# Patient Record
Sex: Female | Born: 1995
Health system: Southern US, Community
[De-identification: ages and names within clinical notes are randomized; demographics above are authoritative.]

## PROBLEM LIST (undated history)

## (undated) ENCOUNTER — Inpatient Hospital Stay (HOSPITAL_COMMUNITY): Payer: Self-pay

## (undated) DIAGNOSIS — Z87730 Personal history of (corrected) cleft lip and palate: Secondary | ICD-10-CM

## (undated) HISTORY — PX: CLEFT LIP REPAIR: SUR1164

## (undated) HISTORY — PX: CLEFT LIP REPAIR: SHX5315

## (undated) HISTORY — DX: Personal history of (corrected) cleft lip and palate: Z87.730

---
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
## 2017-01-13 IMAGING — US US OB LIMITED
1 series · 14 of 27 positions shown · non-contrast
Comparison: none

CLINICAL DATA: C/o sporadic lower back pain x 1 wk, patient has had
no prenatal care and is unsure of GA (LMP sometime around 04/24/14)

EXAM:
LIMITED OBSTETRIC ULTRASOUND

[Series 1: us ob limited · 0.20mm/px · 14 of 27 slices shown]
[im 1/27]
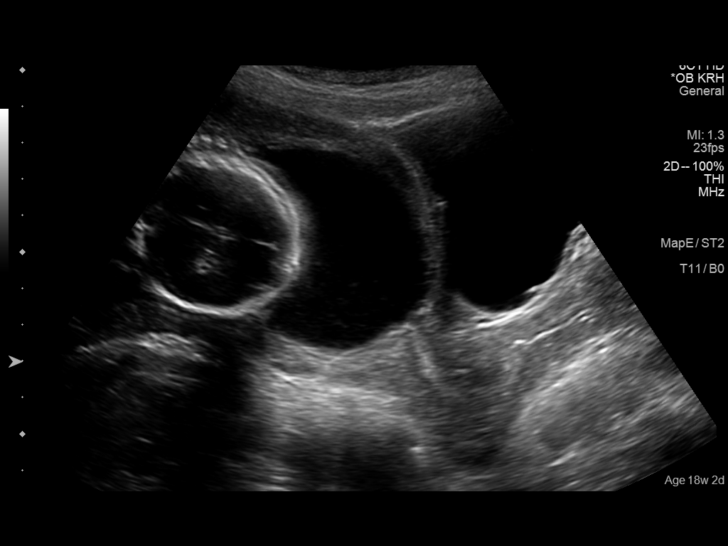
[im 3/27]
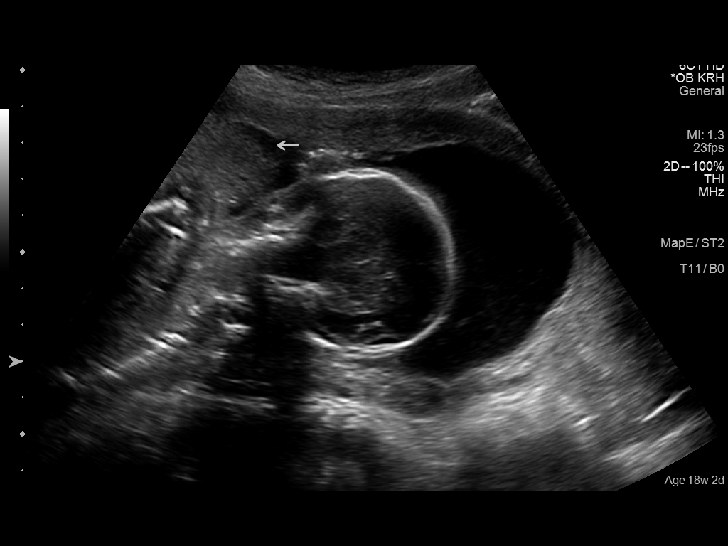
[im 5/27]
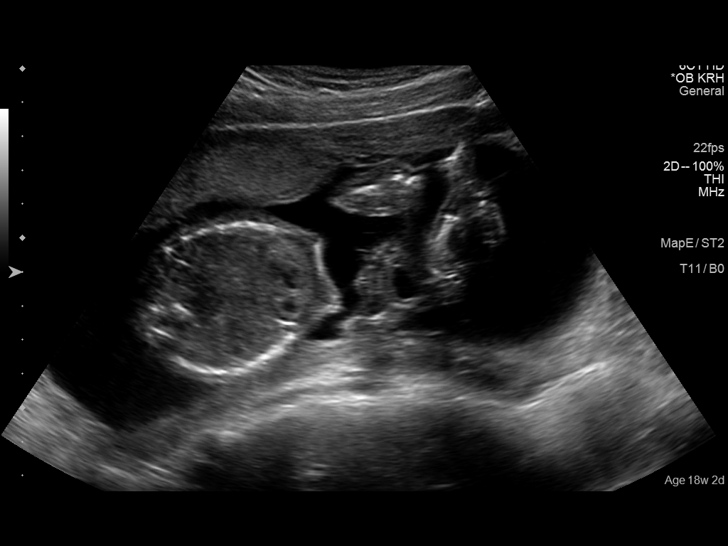
[im 7/27]
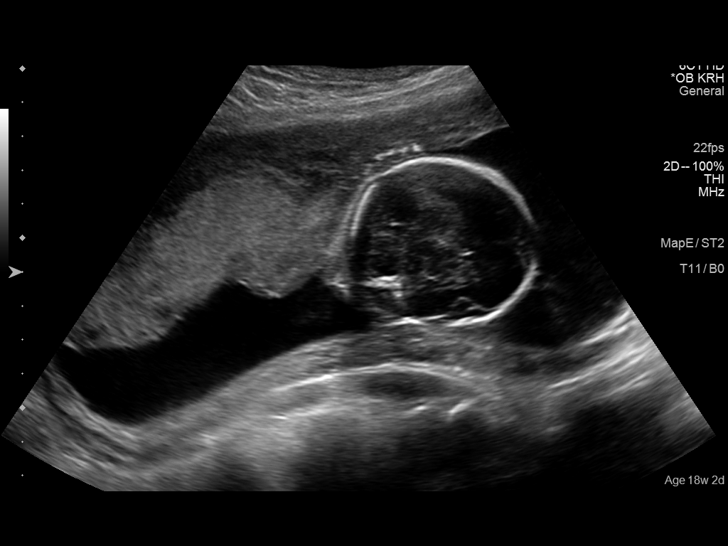
[im 9/27]
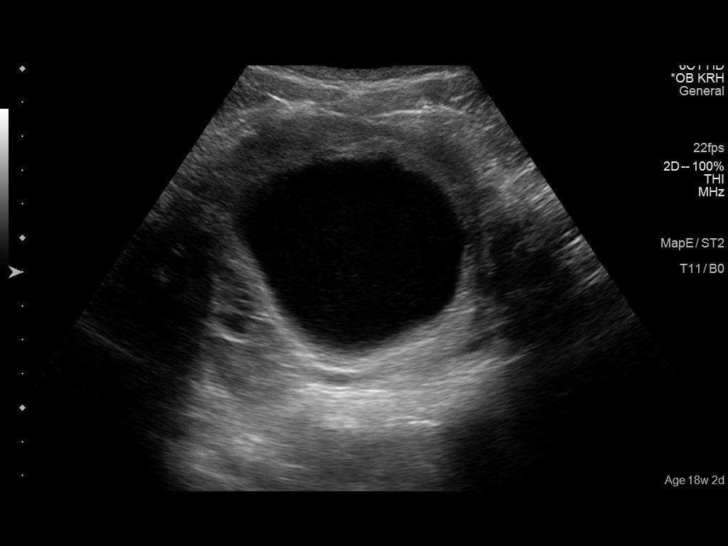
[im 11/27]
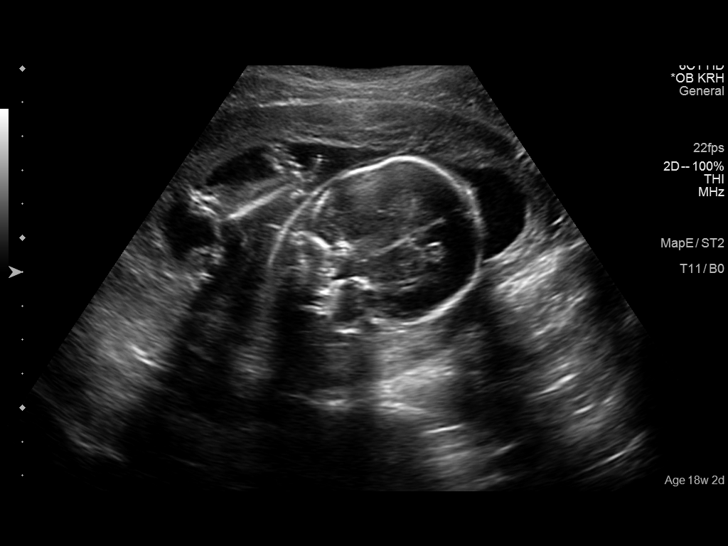
[im 13/27]
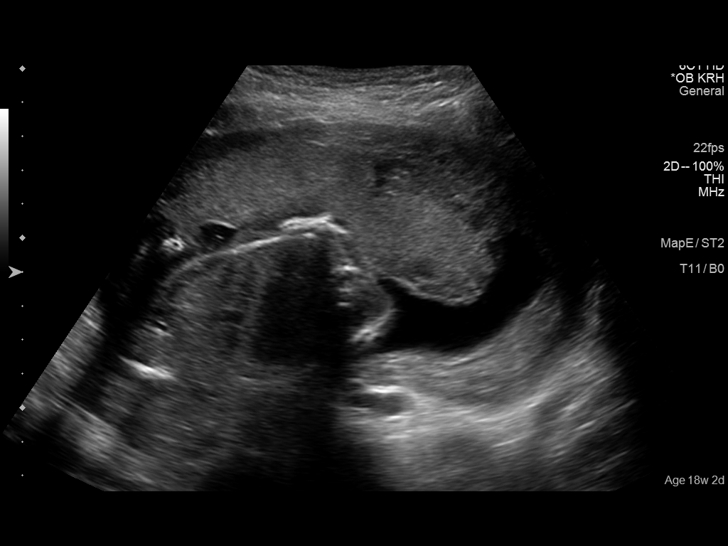
[im 15/27]
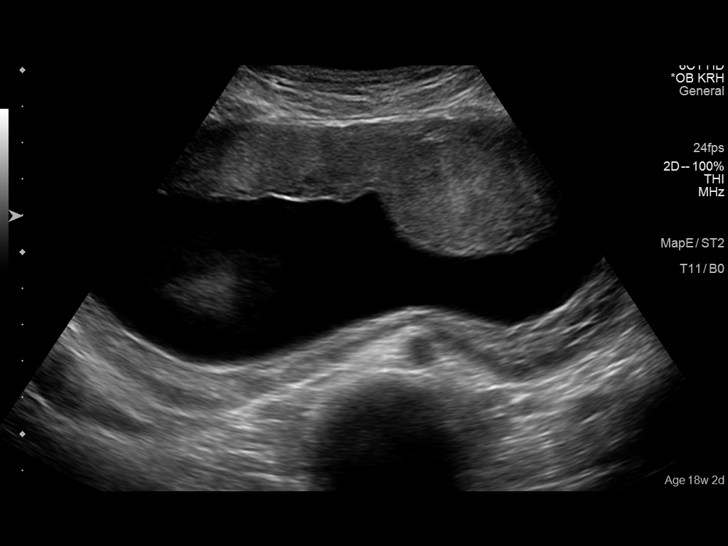
[im 17/27]
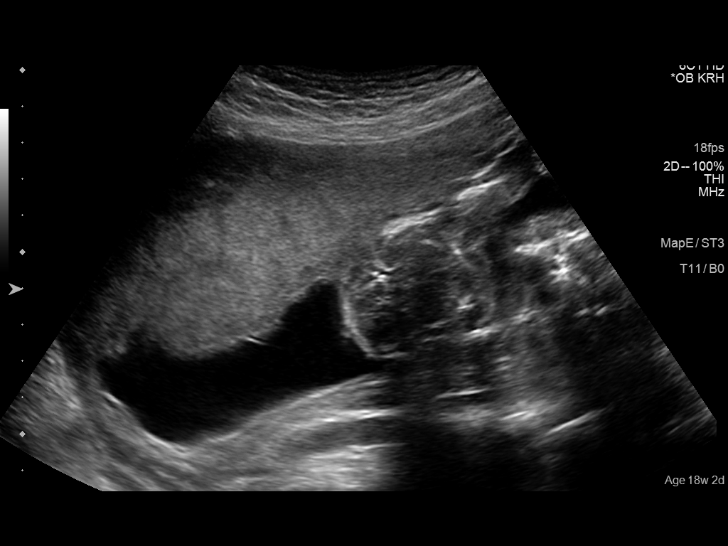
[im 19/27]
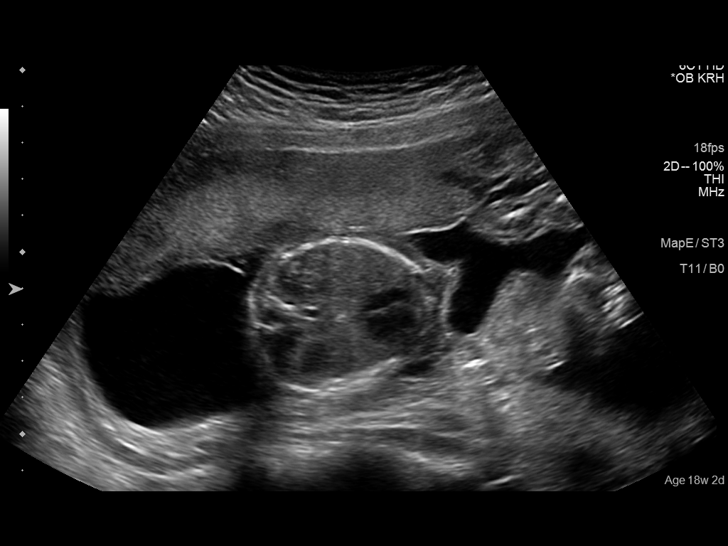
[im 21/27]
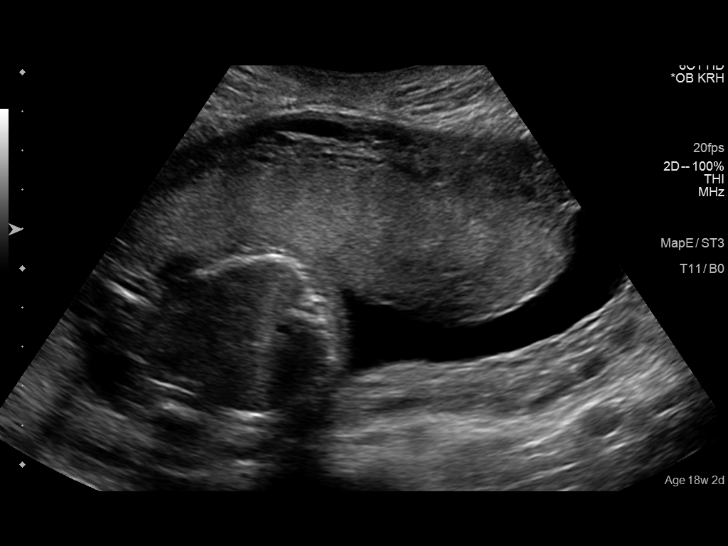
[im 23/27]
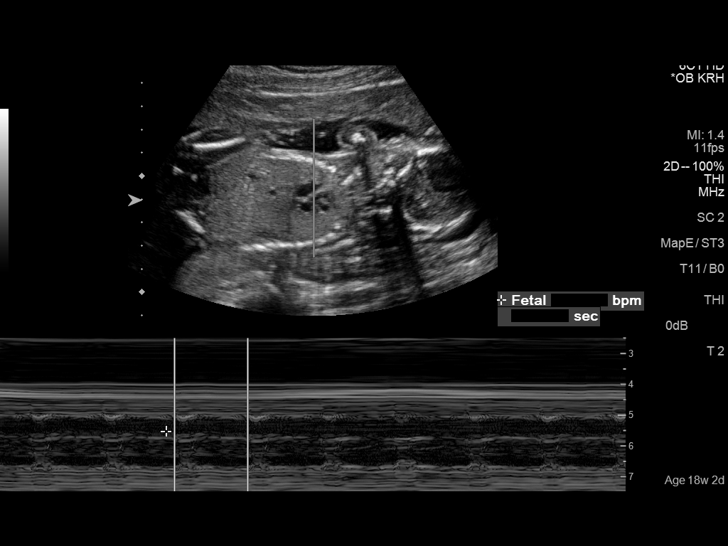
[im 25/27]
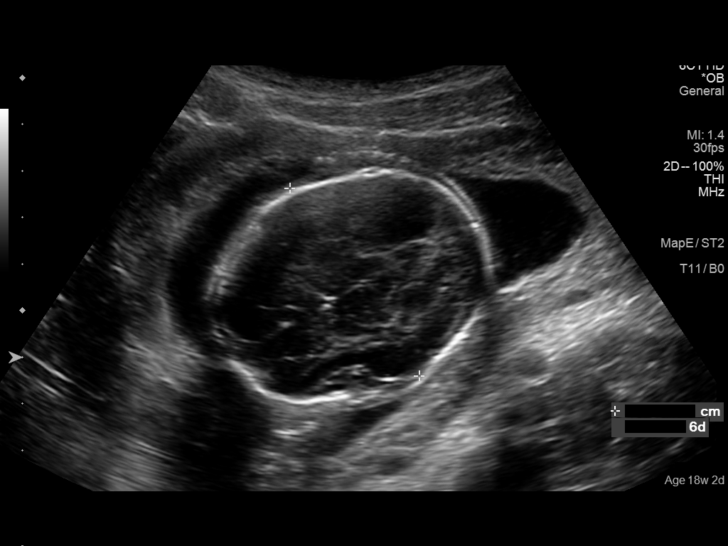
[im 27/27]
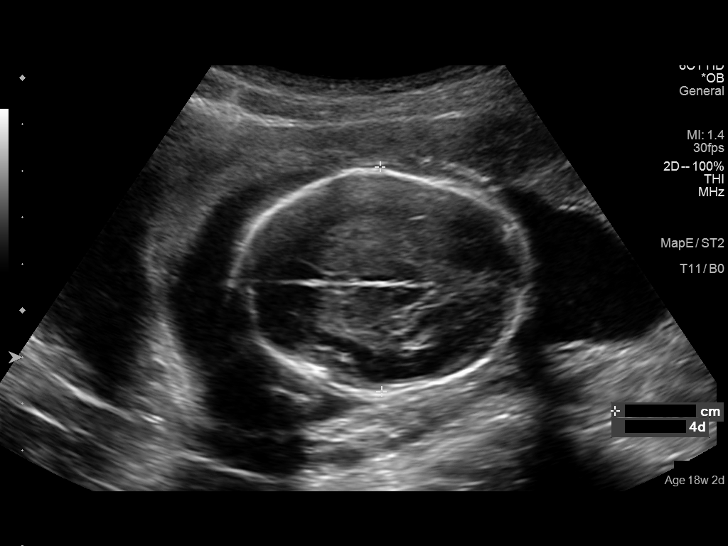

[14 of 27 positions shown; findings below may reference images not displayed]

FINDINGS: Number of Fetuses: 1

Heart Rate:  140 bpm

Movement: Yes

Presentation: Cephalic

Placental Location: Fundal and anterior

Previa: No

Amniotic Fluid (Subjective):  Within normal limits.

BPD:  4.8cm 20w  3d

MATERNAL FINDINGS:

Cervix:  Appears closed.

Uterus/Adnexae:  No abnormality visualized.
IMPRESSION: Single live intrauterine pregnancy with a measured gestational age
of 20 weeks and 3 days. No emergent fetal or maternal finding.

This exam is performed on an emergent basis and does not
comprehensively evaluate fetal size, dating, or anatomy; follow-up
complete OB US should be considered if further fetal assessment is
warranted.

## 2017-04-10 IMAGING — US US OB LIMITED
1 series · 13 of 13 positions shown · non-contrast
Comparison: none

CLINICAL DATA: Vaginal bleeding.  Thirty-two weeks pregnant.

EXAM:
LIMITED OBSTETRIC ULTRASOUND

[Series 1: us ob limited · 0.21mm/px · 13 acquisitions, 13 frames shown]
[im 1/13]
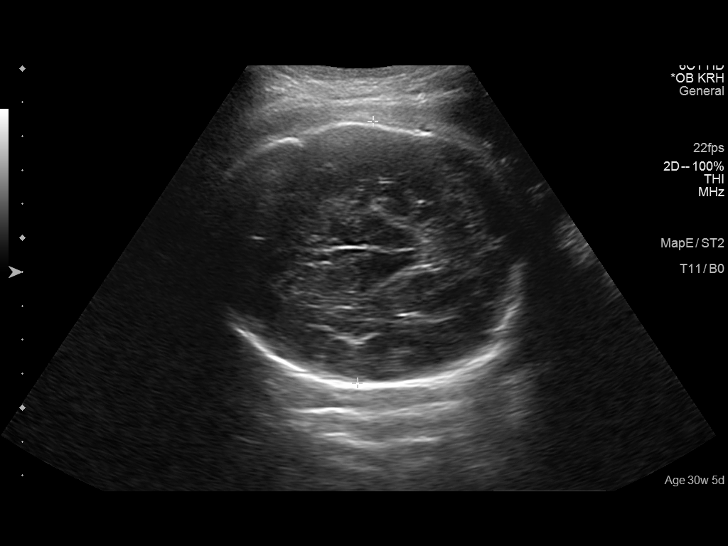
[im 2/13]
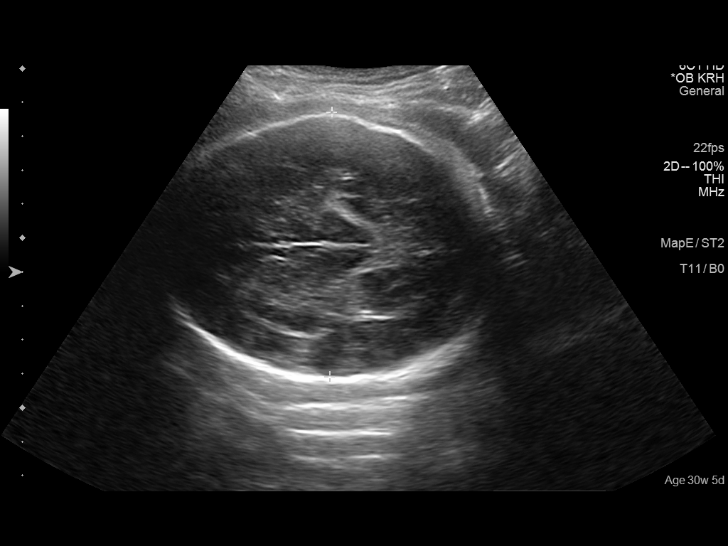
[im 3/13]
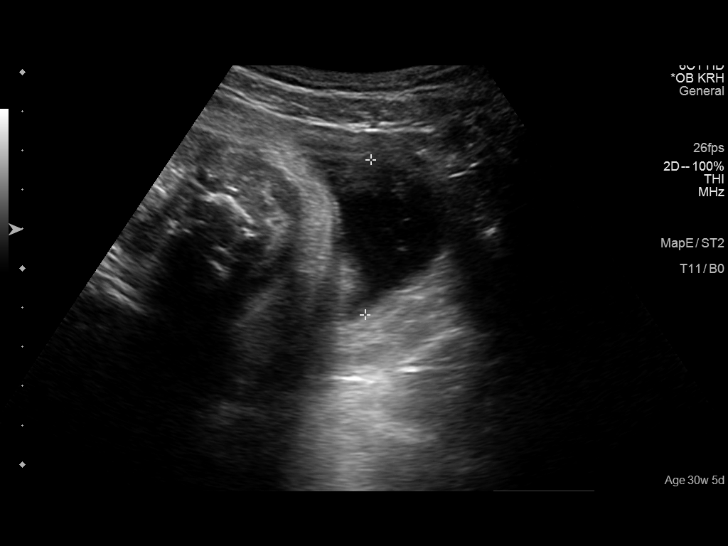
[im 4/13]
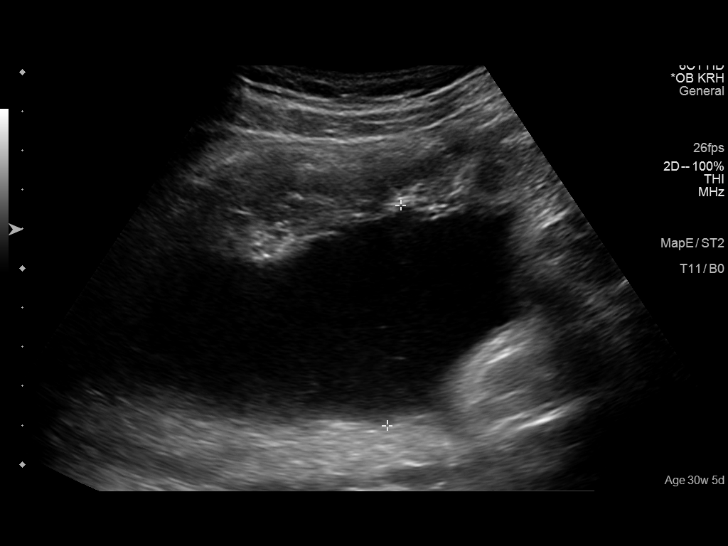
[im 5/13]
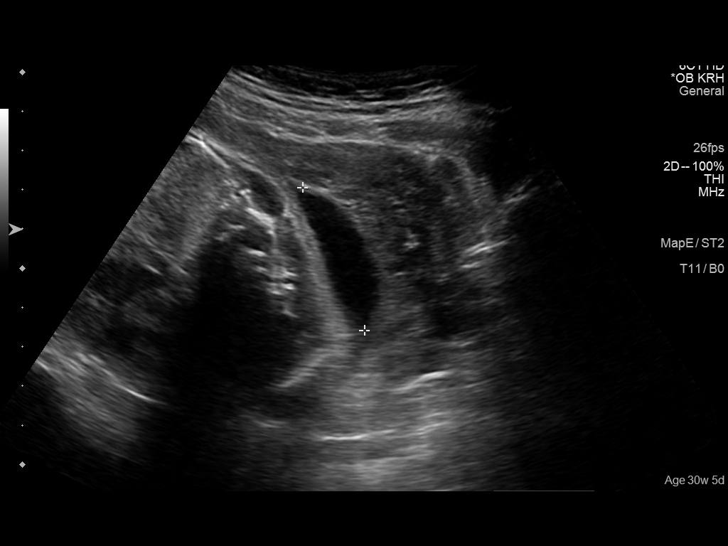
[im 6/13]
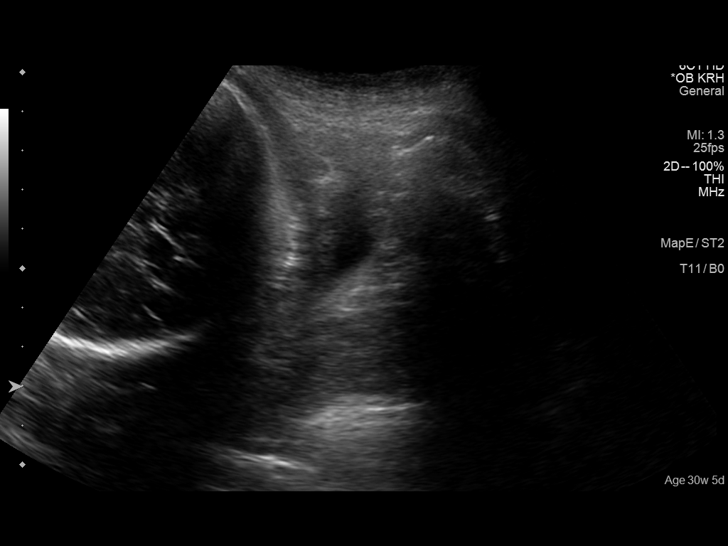
[im 7/13]
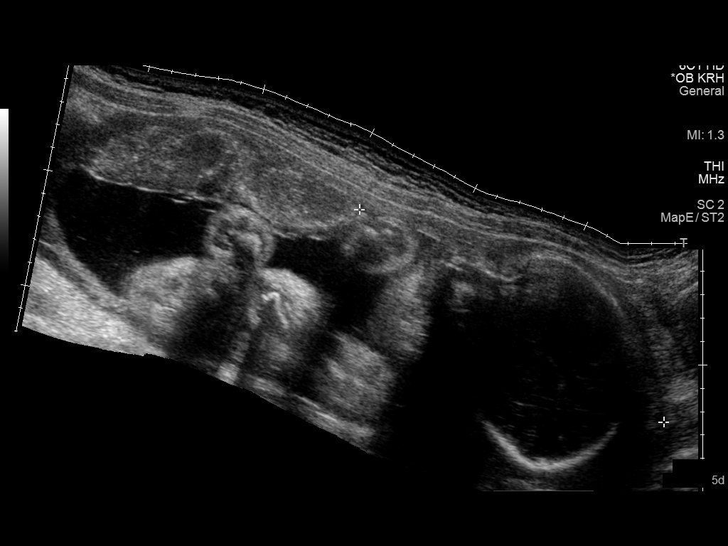
[im 8/13]
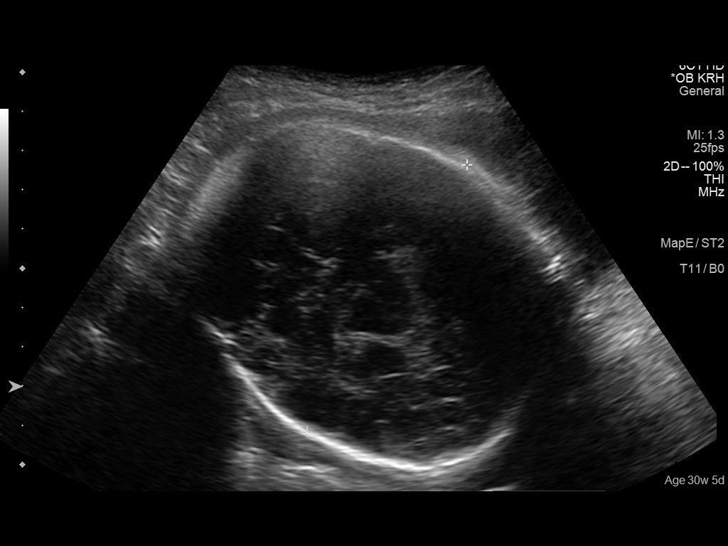
[im 9/13]
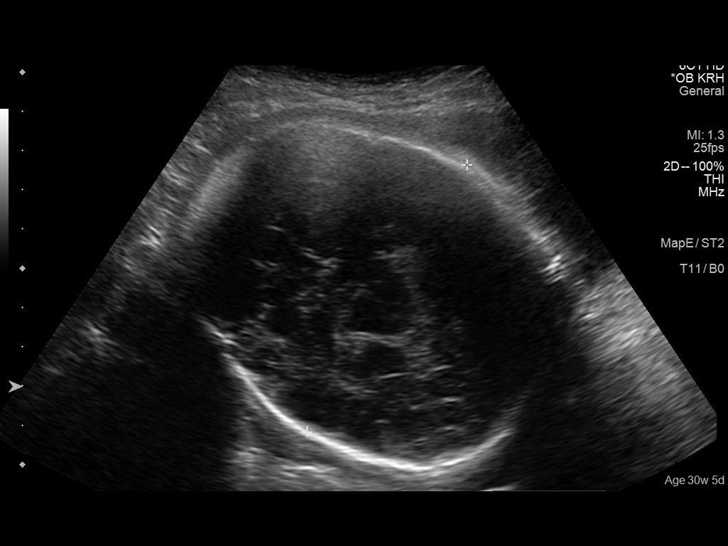
[im 10/13]
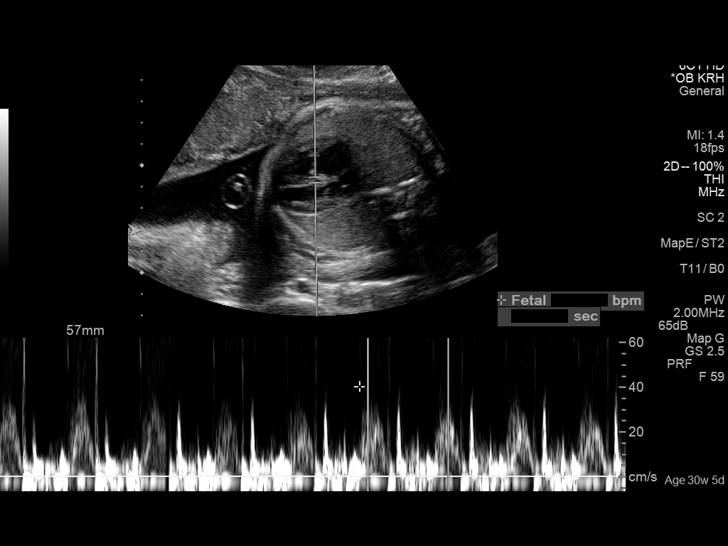
[im 11/13]
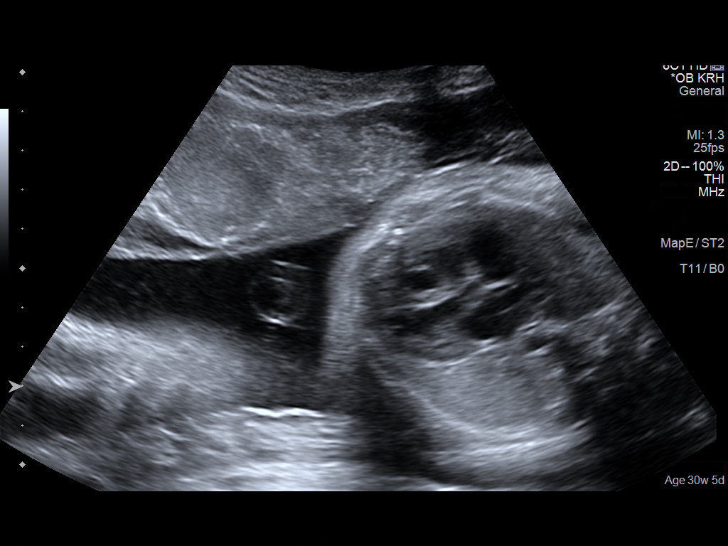
[im 12/13]
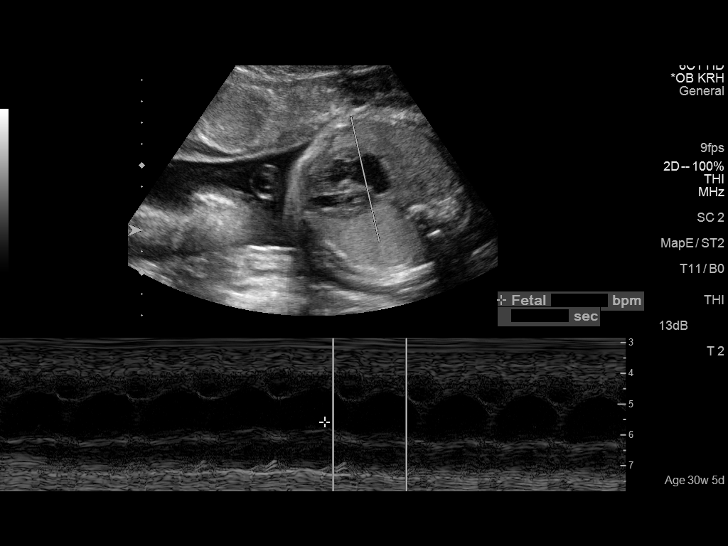
[im 13/13]
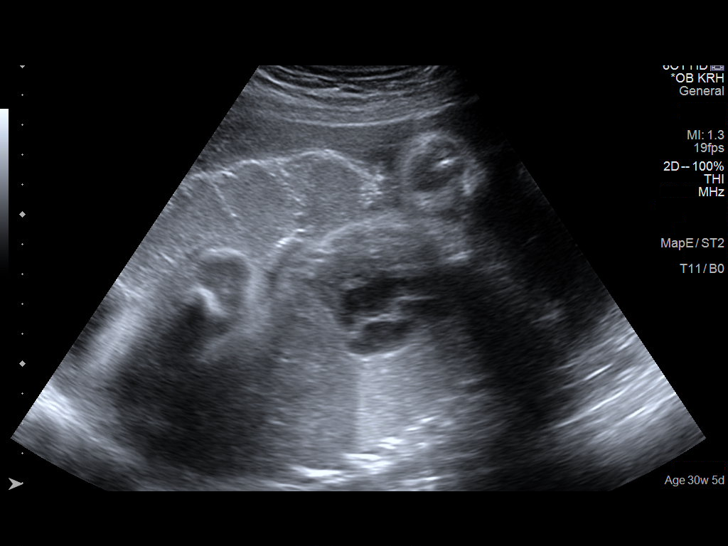

[13 of 13 positions shown; findings below may reference images not displayed]

FINDINGS: Number of Fetuses: 1

Heart Rate:  140 bpm

Movement: Yes

Presentation: Cephalic

Placental Location: Fundal and anterior

Previa: No

Amniotic Fluid (Subjective):  Within normal limits.

AFI = 18.1 cm

BPD:  7.8cm 31w  2d

MATERNAL FINDINGS:

Cervix:  Appears closed.

Uterus/Adnexae:  No abnormality visualized.
IMPRESSION: Single living intrauterine fetus in cephalic presentation. No acute
maternal findings visualized.

This exam is performed on an emergent basis and does not
comprehensively evaluate fetal size, dating, or anatomy; follow-up
complete OB US should be considered if further fetal assessment is
warranted.

## 2017-06-02 ENCOUNTER — Encounter: Payer: Self-pay | Admitting: *Deleted

## 2017-07-11 ENCOUNTER — Encounter: Payer: Self-pay | Admitting: Medical

## 2017-07-11 ENCOUNTER — Ambulatory Visit (INDEPENDENT_AMBULATORY_CARE_PROVIDER_SITE_OTHER): Payer: Medicaid Other | Admitting: Medical

## 2017-07-11 VITALS — BP 124/62 | HR 75 | Wt 156.5 lb

## 2017-07-11 DIAGNOSIS — Z309 Encounter for contraceptive management, unspecified: Secondary | ICD-10-CM

## 2017-07-11 DIAGNOSIS — T839XXA Unspecified complication of genitourinary prosthetic device, implant and graft, initial encounter: Secondary | ICD-10-CM

## 2017-07-11 NOTE — Patient Instructions (Signed)

## 2017-07-11 NOTE — Progress Notes (Signed)
  History:  Ms. Morgan Nelson is a 22 y.o. G1P1001 who presents to clinic today for IUD check due to irregular bleeding. The patient states that she was having fairly regular periods. She had IUD placed at her PP visit in 02/2015. She states that her period was longer and heavier in March, lasting 11 days. She states periods are usually 5 days and lighter. She states that bleeding started again on 4/3 and has been off and on over the last few days which prompted her to get evaluated.    The following portions of the patient's history were reviewed and updated as appropriate: allergies, current medications, family history, past medical history, social history, past surgical history and problem list.  Review of Systems:  Review of Systems  Constitutional: Negative for malaise/fatigue.  Gastrointestinal: Negative for abdominal pain.  Genitourinary:       + vaginal bleeding  Neurological: Negative for dizziness.      Objective:  Physical Exam BP 124/62 (BP Location: Right Arm, Patient Position: Sitting, Cuff Size: Normal)   Pulse 75   Wt 156 lb 8 oz (71 kg)   LMP 07/06/2017   BMI 27.72 kg/m  Physical Exam  Constitutional: She is oriented to person, place, and time. She appears well-developed and well-nourished. No distress.  HENT:  Head: Normocephalic.  Cardiovascular: Normal rate.  Pulmonary/Chest: Effort normal.  Abdominal: Soft.  Genitourinary: Uterus is not enlarged and not tender. Cervix exhibits no motion tenderness, no discharge and no friability. Right adnexum displays no mass and no tenderness. Left adnexum displays no mass and no tenderness. There is bleeding in the vagina. No vaginal discharge found.  Genitourinary Comments: Unable to visualize or palpate IUD strings  Neurological: She is alert and oriented to person, place, and time.  Skin: Skin is warm and dry. No erythema.  Psychiatric: She has a normal mood and affect.  Vitals reviewed.   Assessment & Plan:  1.  Complication of intrauterine device (IUD), unspecified complication, initial encounter (HCC) - US PELVIC COMPLETE WITH TRANSVAGINAL; will schedule and call patient with appointment  - Condom use or abstinence until IUD is visualized with US advised - Will follow-up based on US results to determine if change in contraceptive method is needed   Kathlene CoteWenzel, Morgan Teed N, PA-C 07/11/2017 7:31 PM

## 2017-07-18 ENCOUNTER — Encounter: Payer: Self-pay | Admitting: General Practice

## 2017-07-20 ENCOUNTER — Telehealth: Payer: Self-pay | Admitting: General Practice

## 2017-07-20 NOTE — Telephone Encounter (Signed)
Per chart review, patient has not checked mychart message regarding appt. Called patient, no answer- left message stating I am trying to reach you regarding an appt I have scheduled for you tomorrow, please call me back.

## 2017-07-21 ENCOUNTER — Ambulatory Visit (HOSPITAL_COMMUNITY)
Admission: RE | Admit: 2017-07-21 | Discharge: 2017-07-21 | Disposition: A | Discharge status: Home / Self Care | Payer: Medicaid Other | Source: Ambulatory Visit | Attending: Medical | Admitting: Medical

## 2017-07-21 DIAGNOSIS — T839XXA Unspecified complication of genitourinary prosthetic device, implant and graft, initial encounter: Secondary | ICD-10-CM | POA: Insufficient documentation

## 2017-07-21 DIAGNOSIS — N854 Malposition of uterus: Secondary | ICD-10-CM | POA: Insufficient documentation

## 2019-01-11 ENCOUNTER — Other Ambulatory Visit: Payer: Self-pay

## 2019-01-11 ENCOUNTER — Emergency Department (HOSPITAL_BASED_OUTPATIENT_CLINIC_OR_DEPARTMENT_OTHER)
Admission: EM | Admit: 2019-01-11 | Discharge: 2019-01-11 | Disposition: A | Discharge status: Home / Self Care | Payer: Medicaid Other | Attending: Emergency Medicine | Admitting: Emergency Medicine

## 2019-01-11 DIAGNOSIS — Z79899 Other long term (current) drug therapy: Secondary | ICD-10-CM | POA: Insufficient documentation

## 2019-01-11 DIAGNOSIS — U071 COVID-19: Secondary | ICD-10-CM | POA: Insufficient documentation

## 2019-01-11 DIAGNOSIS — Z20822 Contact with and (suspected) exposure to covid-19: Secondary | ICD-10-CM

## 2019-01-11 LAB — GROUP A STREP BY PCR: Group A Strep by PCR: NOT DETECTED

## 2019-01-11 LAB — SARS CORONAVIRUS 2 (TAT 6-24 HRS): SARS Coronavirus 2: POSITIVE — AB

## 2019-01-11 NOTE — Discharge Instructions (Addendum)
Please stay home and quarantine yourself. Only return to the ER if you are struggling to breath or cannot keep any fluids down.   *If your test is positive then your must quarantine for 10 days from your symptoms onset AND have no fever for 24 hours. This means that if your test is positive then the earliest you can leave your house is Next Thursday October 15th  *If your test is negative you must quarantine yourself for 14 days from your last exposure to your mother. This means, if your test is negative you must quarantine until October 21.

## 2019-01-11 NOTE — ED Triage Notes (Signed)
Pt states cough and sore throat for past 2 days.  Has had recent exposure to mom who has tested positive for covid yesterday.  Denies fever

## 2019-01-11 NOTE — ED Provider Notes (Signed)
[facility] EMERGENCY DEPARTMENT Provider Note   CSN: [id number] Arrival date & time: 01/11/19  1043     History   Chief Complaint Chief Complaint  Patient presents with  . Cough    HPI Morgan Nelson is a 23 y.o. female.     Patient is a 23 year old female with no significant past medical history presenting to the emergency department for cough and sore throat.  Patient reports that the symptoms started this past Sunday.  Reports that they are mild and improved with over-the-counter medications.  Reports that she is concerned because her mother was diagnosed with COVID-19.  She does not live with her mother but has been exposed to her mother 3 times the last week.  The last exposure was yesterday.  Denies any fever, chills, chest pain, shortness of breath, dysuria, diarrhea.  She has had one episode of nonbloody vomiting.     Past Medical History:  Diagnosis Date  . Hx of cleft palate with cleft lip     Patient Active Problem List   Diagnosis Date Noted  . Personal history of corrected cleft lip and palate     Past Surgical History:  Procedure Laterality Date  . CLEFT LIP REPAIR    . CLEFT LIP REPAIR       OB History    Gravida  1   Para  1   Term  1   Preterm      AB      Living  1     SAB      TAB      Ectopic      Multiple  0   Live Births  1            Home Medications    Prior to Admission medications   Medication Sig Start Date End Date Taking? Authorizing Provider  fluticasone (FLONASE) 50 MCG/ACT nasal spray Place 2 sprays into both nostrils daily. Patient not taking: Reported on 07/11/2017 04/13/16   Everlene Farrier, PA-C  Multiple Vitamin (MULTIVITAMIN WITH MINERALS) TABS tablet Take 1 tablet by mouth daily.    [provider]  naproxen (NAPROSYN) 250 MG tablet Take 1 tablet (250 mg total) by mouth 2 (two) times daily with a meal. Patient not taking: Reported on 07/11/2017 04/13/16   Everlene Farrier, PA-C   ondansetron (ZOFRAN) 4 MG tablet Take 1 tablet (4 mg total) by mouth every 8 (eight) hours as needed for nausea or vomiting. Patient not taking: Reported on 07/11/2017 09/02/16   Trixie Dredge, PA-C  Prenatal Multivit-Min-Fe-FA (PRENATAL VITAMINS) 0.8 MG tablet Take 1 tablet by mouth daily. Patient not taking: Reported on 03/28/2015 11/07/14   Wouk, Wilfred Curtis, MD    Family History Family History  Problem Relation Age of Onset  . Hypertension Mother   . Arthritis Neg Hx   . Alcohol abuse Neg Hx   . Asthma Neg Hx   . Birth defects Neg Hx   . Cancer Neg Hx   . COPD Neg Hx   . Depression Neg Hx   . Diabetes Neg Hx   . Drug abuse Neg Hx   . Early death Neg Hx   . Hearing loss Neg Hx   . Hyperlipidemia Neg Hx   . Heart disease Neg Hx   . Kidney disease Neg Hx   . Learning disabilities Neg Hx   . Mental illness Neg Hx   . Mental retardation Neg Hx   . Miscarriages / Stillbirths Neg  Hx   . Stroke Neg Hx   . Vision loss Neg Hx   . Varicose Veins Neg Hx     Social History Social History   Tobacco Use  . Smoking status: Never Smoker  . Smokeless tobacco: Never Used  Substance Use Topics  . Alcohol use: No  . Drug use: No     Allergies   Patient has no known allergies.   Review of Systems Review of Systems  Constitutional: Negative for appetite change, diaphoresis, fatigue and fever.  HENT: Positive for sore throat. Negative for congestion, ear pain, sinus pressure, sinus pain and sneezing.   Respiratory: Positive for cough. Negative for shortness of breath and wheezing.   Cardiovascular: Negative for chest pain and palpitations.  Gastrointestinal: Positive for nausea and vomiting. Negative for abdominal pain, constipation and diarrhea.  Genitourinary: Negative for dysuria.  Musculoskeletal: Negative for arthralgias, back pain and myalgias.  Skin: Negative for rash.  Neurological: Negative for dizziness, light-headedness and headaches.  Hematological: Does not  bruise/bleed easily.     Physical Exam Updated Vital Signs BP 128/70 (BP Location: Right Arm)   Pulse (!) 102   Temp 98.3 F (36.8 C) (Oral)   Resp 16   Ht  (1.6 m)   Wt 78.9 kg   SpO2 100%   BMI 30.82 kg/m   Physical Exam Vitals signs and nursing note reviewed. Exam conducted with a chaperone present.  Constitutional:      Appearance: Normal appearance.  HENT:     Head: Normocephalic.     Right Ear: Tympanic membrane normal.     Left Ear: Tympanic membrane normal.     Nose: Nose normal. No congestion.     Mouth/Throat:     Mouth: Mucous membranes are moist.  Eyes:     Conjunctiva/sclera: Conjunctivae normal.  Cardiovascular:     Rate and Rhythm: Normal rate and regular rhythm.     Pulses: Normal pulses.     Heart sounds: Normal heart sounds.  Pulmonary:     Effort: Pulmonary effort is normal. No respiratory distress.     Breath sounds: Normal breath sounds.  Abdominal:     General: Abdomen is flat. There is no distension.     Tenderness: There is no abdominal tenderness. There is no guarding or rebound.  Skin:    General: Skin is dry.  Neurological:     Mental Status: She is alert.  Psychiatric:        Mood and Affect: Mood normal.      ED Treatments / Results  Labs (all labs ordered are listed, but only abnormal results are displayed) Labs Reviewed  GROUP A STREP BY PCR  SARS CORONAVIRUS 2 (TAT 6-24 HRS)    EKG None  Radiology No results found.  Procedures Procedures (including critical care time)  Medications Ordered in ED Medications - No data to display   Initial Impression / Assessment and Plan / ED Course  I have reviewed the triage vital signs and the nursing notes.  Pertinent labs & imaging results that were available during my care of the patient were reviewed by me and considered in my medical decision making (see chart for details).        Based on review of vitals, medical screening exam, lab work and/or imaging, there  does not appear to be an acute, emergent etiology for the patient's symptoms. Counseled pt on good return precautions and encouraged both PCP and ED follow-up as needed.  Prior to  discharge, I also discussed incidental imaging findings with patient in detail and advised appropriate, recommended follow-up in detail.  Clinical Impression: 1. Suspected 2019 novel coronavirus infection     Disposition: Discharge  Prior to providing a prescription for a controlled substance, I independently reviewed the patient's recent prescription history on the [facility]. The patient had no recent or regular prescriptions and was deemed appropriate for a brief, less than 3 day prescription of narcotic for acute analgesia.  This note was prepared with assistance of Systems analyst. Occasional wrong-word or sound-a-like substitutions may have occurred due to the inherent limitations of voice recognition software.   Final Clinical Impressions(s) / ED Diagnoses   Final diagnoses:  Suspected 2019 novel coronavirus infection    ED Discharge Orders    None       Kristine Royal 01/11/19 1117    Wyvonnia Dusky, MD 01/11/19 1300

## 2019-04-01 ENCOUNTER — Emergency Department (HOSPITAL_BASED_OUTPATIENT_CLINIC_OR_DEPARTMENT_OTHER)
Admission: EM | Admit: 2019-04-01 | Discharge: 2019-04-01 | Discharge status: Absent without leave | Payer: Medicaid Other

## 2019-04-04 ENCOUNTER — Other Ambulatory Visit: Payer: Self-pay

## 2019-04-04 ENCOUNTER — Encounter (HOSPITAL_BASED_OUTPATIENT_CLINIC_OR_DEPARTMENT_OTHER): Payer: Self-pay

## 2019-04-04 ENCOUNTER — Emergency Department (HOSPITAL_BASED_OUTPATIENT_CLINIC_OR_DEPARTMENT_OTHER)
Admission: EM | Admit: 2019-04-04 | Discharge: 2019-04-04 | Disposition: A | Discharge status: Home / Self Care | Payer: Medicaid Other | Attending: Emergency Medicine | Admitting: Emergency Medicine

## 2019-04-04 DIAGNOSIS — R0981 Nasal congestion: Secondary | ICD-10-CM | POA: Insufficient documentation

## 2019-04-04 DIAGNOSIS — R070 Pain in throat: Secondary | ICD-10-CM | POA: Diagnosis present

## 2019-04-04 DIAGNOSIS — R05 Cough: Secondary | ICD-10-CM | POA: Diagnosis not present

## 2019-04-04 DIAGNOSIS — R509 Fever, unspecified: Secondary | ICD-10-CM | POA: Diagnosis not present

## 2019-04-04 DIAGNOSIS — R5381 Other malaise: Secondary | ICD-10-CM | POA: Diagnosis not present

## 2019-04-04 DIAGNOSIS — J069 Acute upper respiratory infection, unspecified: Secondary | ICD-10-CM | POA: Diagnosis not present

## 2019-04-04 DIAGNOSIS — Z8619 Personal history of other infectious and parasitic diseases: Secondary | ICD-10-CM | POA: Insufficient documentation

## 2019-04-04 DIAGNOSIS — H9202 Otalgia, left ear: Secondary | ICD-10-CM | POA: Diagnosis not present

## 2019-04-04 DIAGNOSIS — Z20828 Contact with and (suspected) exposure to other viral communicable diseases: Secondary | ICD-10-CM | POA: Insufficient documentation

## 2019-04-04 LAB — SARS CORONAVIRUS 2 (TAT 6-24 HRS): SARS Coronavirus 2: NEGATIVE

## 2019-04-04 NOTE — Discharge Instructions (Signed)
Your Covid test will take 24 to 48 hours to resolve.  In the meantime I recommend you self quarantine.

## 2019-04-04 NOTE — ED Triage Notes (Signed)
Pt c/o flu like sx x 4 days-NAD-steady gait 

## 2019-04-04 NOTE — ED Provider Notes (Signed)
[facility] EMERGENCY DEPARTMENT Provider Note   CSN: [id number] Arrival date & time: 04/04/19  1218     History Chief Complaint  Patient presents with  . Cough    Morgan Nelson is a 23 y.o. female presenting to emergency department with sore throat, cough and congestion.  She reports her symptoms began Sunday with a sore throat.  Monday when she woke up she felt like she had a fever and checked her temp and it was 100.27F.  For the past 3 days she has had persistent coughing.  She also describes an ache in her left ear.  She feels generally malaised.  Hx of strep throat most recently 1 year ago  She does report that she was tested positive for Covid back in October.  She subsequently has had a Covid test in November, at [employer] where she works, and that test was negative.  She is concerned she may have been reinfected.  NKDA No medical problems No meds at baseline  HPI     Past Medical History:  Diagnosis Date  . Hx of cleft palate with cleft lip     Patient Active Problem List   Diagnosis Date Noted  . Personal history of corrected cleft lip and palate     Past Surgical History:  Procedure Laterality Date  . CLEFT LIP REPAIR    . CLEFT LIP REPAIR       OB History    Gravida  1   Para  1   Term  1   Preterm      AB      Living  1     SAB      TAB      Ectopic      Multiple  0   Live Births  1           Family History  Problem Relation Age of Onset  . Hypertension Mother   . Arthritis Neg Hx   . Alcohol abuse Neg Hx   . Asthma Neg Hx   . Birth defects Neg Hx   . Cancer Neg Hx   . COPD Neg Hx   . Depression Neg Hx   . Diabetes Neg Hx   . Drug abuse Neg Hx   . Early death Neg Hx   . Hearing loss Neg Hx   . Hyperlipidemia Neg Hx   . Heart disease Neg Hx   . Kidney disease Neg Hx   . Learning disabilities Neg Hx   . Mental illness Neg Hx   . Mental retardation Neg Hx   . Miscarriages / Stillbirths Neg Hx   .  Stroke Neg Hx   . Vision loss Neg Hx   . Varicose Veins Neg Hx     Social History   Tobacco Use  . Smoking status: Never Smoker  . Smokeless tobacco: Never Used  Substance Use Topics  . Alcohol use: No  . Drug use: No    Home Medications Prior to Admission medications   Medication Sig Start Date End Date Taking? Authorizing Provider  fluticasone (FLONASE) 50 MCG/ACT nasal spray Place 2 sprays into both nostrils daily. Patient not taking: Reported on 07/11/2017 04/13/16   Waynetta Pean, PA-C  Multiple Vitamin (MULTIVITAMIN WITH MINERALS) TABS tablet Take 1 tablet by mouth daily.    [provider]  naproxen (NAPROSYN) 250 MG tablet Take 1 tablet (250 mg total) by mouth 2 (two) times daily with a meal. Patient not  taking: Reported on 07/11/2017 04/13/16   Everlene Farrieransie, William, PA-C  ondansetron (ZOFRAN) 4 MG tablet Take 1 tablet (4 mg total) by mouth every 8 (eight) hours as needed for nausea or vomiting. Patient not taking: Reported on 07/11/2017 09/02/16   Trixie DredgeWest, Emily, PA-C  Prenatal Multivit-Min-Fe-FA (PRENATAL VITAMINS) 0.8 MG tablet Take 1 tablet by mouth daily. Patient not taking: Reported on 03/28/2015 11/07/14   Wouk, Wilfred CurtisNoah Bedford, MD    Allergies    Patient has no known allergies.  Review of Systems   Review of Systems  Constitutional: Positive for appetite change, fatigue and fever. Negative for chills.  HENT: Positive for congestion, ear pain, rhinorrhea and sore throat. Negative for ear discharge, trouble swallowing and voice change.   Eyes: Negative for photophobia and visual disturbance.  Respiratory: Positive for cough. Negative for shortness of breath.   Cardiovascular: Negative for chest pain, palpitations and leg swelling.  Gastrointestinal: Negative for abdominal pain, nausea and vomiting.  Genitourinary: Negative for difficulty urinating and dysuria.  Musculoskeletal: Positive for myalgias. Negative for arthralgias.  Skin: Negative for pallor and rash.   Neurological: Negative for syncope and light-headedness.  All other systems reviewed and are negative.    Physical Exam Updated Vital Signs BP 116/76 (BP Location: Left Arm)   Pulse (!) 105   Temp 98.5 F (36.9 C) (Oral)   Resp 16   Ht 5\' 3"  (1.6 m)   Wt 79.4 kg   SpO2 100%   BMI 31.00 kg/m   Physical Exam Vitals and nursing note reviewed.  Constitutional:      General: She is not in acute distress.    Appearance: She is well-developed.  HENT:     Head: Normocephalic and atraumatic.     Comments: Oropharynx non-erythematous.  No tonsillar swelling or exudate.  No uvular deviation.  No drooling. No brawny edema. No stridor. Voice is not muffled.     Right Ear: Hearing, tympanic membrane and ear canal normal.     Left Ear: Hearing, tympanic membrane and ear canal normal.  Eyes:     Conjunctiva/sclera: Conjunctivae normal.  Cardiovascular:     Rate and Rhythm: Regular rhythm. Tachycardia present.     Pulses: Normal pulses.     Heart sounds: No murmur.     Comments: Hr 100 Pulmonary:     Effort: Pulmonary effort is normal. No respiratory distress.     Breath sounds: Normal breath sounds.  Musculoskeletal:     Cervical back: Neck supple.  Skin:    General: Skin is warm and dry.  Neurological:     Mental Status: She is alert.  Psychiatric:        Mood and Affect: Mood normal.        Behavior: Behavior normal.     ED Results / Procedures / Treatments   Labs (all labs ordered are listed, but only abnormal results are displayed) Labs Reviewed  SARS CORONAVIRUS 2 (TAT 6-24 HRS)    EKG None  Radiology No results found.  Procedures Procedures (including critical care time)  Medications Ordered in ED Medications - No data to display  ED Course  I have reviewed the triage vital signs and the nursing notes.  Pertinent labs & imaging results that were available during my care of the patient were reviewed by me and considered in my medical decision making  (see chart for details).  23 yo female presenting with cough, congestion, fevers, sore throat.  No signs or symptoms of strep throat or  peritonsillar abscess or dental infection on oral exam  No fever or pulmonary findings suggestive of PNA  TM's clear, no sign of otitis media  Low suspicion for bacterial infection or sepsis.  This is likely a viral syndrome.  She has had COVID in the past, but has since tested negative.  Reasonable to retest her today, given her potential work restrictions [employer]).  Otherwise stable for discharge  [place] Schmieg was evaluated in Emergency Department on 04/04/2019 for the symptoms described in the history of present illness. She was evaluated in the context of the global COVID-19 pandemic, which necessitated consideration that the patient might be at risk for infection with the SARS-CoV-2 virus that causes COVID-19. Institutional protocols and algorithms that pertain to the evaluation of patients at risk for COVID-19 are in a state of rapid change based on information released by regulatory bodies including the CDC and federal and state organizations. These policies and algorithms were followed during the patient's care in the ED.  This note was dictated using dragon dictation software.  Please be aware that there may be minor translation errors as a result of this oral dictation  Final Clinical Impression(s) / ED Diagnoses Final diagnoses:  Viral URI with cough    Rx / DC Orders ED Discharge Orders    None       Jakoby Melendrez, Kermit Balo, MD 04/04/19 [phone]

## 2021-05-04 DIAGNOSIS — Z975 Presence of (intrauterine) contraceptive device: Secondary | ICD-10-CM | POA: Insufficient documentation

## 2021-05-04 DIAGNOSIS — G43109 Migraine with aura, not intractable, without status migrainosus: Secondary | ICD-10-CM | POA: Insufficient documentation

## 2022-08-06 ENCOUNTER — Ambulatory Visit (INDEPENDENT_AMBULATORY_CARE_PROVIDER_SITE_OTHER): Payer: BLUE CROSS/BLUE SHIELD

## 2022-08-06 ENCOUNTER — Ambulatory Visit
Admission: EM | Admit: 2022-08-06 | Discharge: 2022-08-06 | Disposition: A | Discharge status: Home / Self Care | Payer: BLUE CROSS/BLUE SHIELD | Attending: Family Medicine | Admitting: Family Medicine

## 2022-08-06 DIAGNOSIS — M25561 Pain in right knee: Secondary | ICD-10-CM

## 2022-08-06 DIAGNOSIS — M79671 Pain in right foot: Secondary | ICD-10-CM

## 2022-08-06 DIAGNOSIS — M25571 Pain in right ankle and joints of right foot: Secondary | ICD-10-CM

## 2022-08-06 NOTE — ED Provider Notes (Signed)
[facility]    CSN: [id number] Arrival date & time: 08/06/22  0820      History   Chief Complaint Chief Complaint  Patient presents with   Foot Pain    HPI [name] is a 27 y.o. female.   HPI 27 year old female presents with right foot pain for 4 months.  Reports taking Tylenol for pain.  PMH significant for obesity and migraine with aura.  Patient is accompanied by her friend this afternoon.  Past Medical History:  Diagnosis Date   Hx of cleft palate with cleft lip     Patient Active Problem List   Diagnosis Date Noted   Migraine with aura 05/04/2021   IUD (intrauterine device) in place 05/04/2021   Personal history of corrected cleft lip and palate     Past Surgical History:  Procedure Laterality Date   CLEFT LIP REPAIR     CLEFT LIP REPAIR      OB History     Gravida  1   Para  1   Term  1   Preterm      AB      Living  1      SAB      IAB      Ectopic      Multiple  0   Live Births  1            Home Medications    Prior to Admission medications   Medication Sig Start Date End Date Taking? Authorizing Provider  fluticasone (FLONASE) 50 MCG/ACT nasal spray Place 2 sprays into both nostrils daily. Patient not taking: Reported on 07/11/2017 04/13/16   Everlene Farrier, PA-C  Multiple Vitamin (MULTIVITAMIN WITH MINERALS) TABS tablet Take 1 tablet by mouth daily.    [provider]  naproxen (NAPROSYN) 250 MG tablet Take 1 tablet (250 mg total) by mouth 2 (two) times daily with a meal. Patient not taking: Reported on 07/11/2017 04/13/16   Everlene Farrier, PA-C  ondansetron (ZOFRAN) 4 MG tablet Take 1 tablet (4 mg total) by mouth every 8 (eight) hours as needed for nausea or vomiting. Patient not taking: Reported on 07/11/2017 09/02/16   Trixie Dredge, PA-C  Prenatal Multivit-Min-Fe-FA (PRENATAL VITAMINS) 0.8 MG tablet Take 1 tablet by mouth daily. Patient not taking: Reported on 03/28/2015 11/07/14   Wouk, Wilfred Curtis, MD     Family History Family History  Problem Relation Age of Onset   Hypertension Mother    Arthritis Neg Hx    Alcohol abuse Neg Hx    Asthma Neg Hx    Birth defects Neg Hx    Cancer Neg Hx    COPD Neg Hx    Depression Neg Hx    Diabetes Neg Hx    Drug abuse Neg Hx    Early death Neg Hx    Hearing loss Neg Hx    Hyperlipidemia Neg Hx    Heart disease Neg Hx    Kidney disease Neg Hx    Learning disabilities Neg Hx    Mental illness Neg Hx    Mental retardation Neg Hx    Miscarriages / Stillbirths Neg Hx    Stroke Neg Hx    Vision loss Neg Hx    Varicose Veins Neg Hx     Social History Social History   Tobacco Use   Smoking status: Never   Smokeless tobacco: Never  Vaping Use   Vaping Use: Never used  Substance Use Topics  Alcohol use: No   Drug use: No     Allergies   Patient has no known allergies.   Review of Systems Review of Systems  Musculoskeletal:        Foot pain x 4 months     Physical Exam Triage Vital Signs ED Triage Vitals  Enc Vitals Group     BP 08/06/22 0841 (!) 146/79     Pulse Rate 08/06/22 0841 98     Resp 08/06/22 0841 18     Temp 08/06/22 0841 98.1 F (36.7 C)     Temp Source 08/06/22 0841 Oral     SpO2 08/06/22 0841 98 %     Weight --      Height --      Head Circumference --      Peak Flow --      Pain Score 08/06/22 0835 8     Pain Loc --      Pain Edu? --      Excl. in GC? --    No data found.  Updated Vital Signs BP (!) 146/79 (BP Location: Left Arm)   Pulse 98   Temp 98.1 F (36.7 C) (Oral)   Resp 18   LMP 08/04/2022 (Approximate)   SpO2 98%      Physical Exam Vitals and nursing note reviewed.  Constitutional:      Appearance: Normal appearance. She is normal weight.  HENT:     Head: Normocephalic and atraumatic.     Mouth/Throat:     Mouth: Mucous membranes are moist.     Pharynx: Oropharynx is clear.  Eyes:     Extraocular Movements: Extraocular movements intact.     Conjunctiva/sclera:  Conjunctivae normal.     Pupils: Pupils are equal, round, and reactive to light.  Cardiovascular:     Rate and Rhythm: Normal rate and regular rhythm.     Pulses: Normal pulses.     Heart sounds: Normal heart sounds.  Pulmonary:     Effort: Pulmonary effort is normal.     Breath sounds: Normal breath sounds. No wheezing, rhonchi or rales.  Musculoskeletal:        General: Normal range of motion.     Cervical back: Normal range of motion and neck supple.     Comments: Right foot: FROM with dorsi/plantar flexion, patient reports mild tender knot over medial aspect at arch-unnoticed on exam today  Skin:    General: Skin is warm and dry.  Neurological:     General: No focal deficit present.     Mental Status: She is alert and oriented to person, place, and time. Mental status is at baseline.      UC Treatments / Results  Labs (all labs ordered are listed, but only abnormal results are displayed) Labs Reviewed - No data to display  EKG   Radiology DG Foot Complete Right  Result Date: 08/06/2022 CLINICAL DATA:  27 year old female with medial right foot pain for 4 months. EXAM: RIGHT FOOT COMPLETE - 3+ VIEW COMPARISON:  None Available. FINDINGS: There is no evidence of fracture or dislocation. There is no evidence of arthropathy or other focal bone abnormality. Bone mineralization is within normal limits. No discrete soft tissue abnormality. IMPRESSION: Negative. Electronically Signed   By: Odessa Fleming M.D.   On: 08/06/2022 09:09    Procedures Procedures (including critical care time)  Medications Ordered in UC Medications - No data to display  Initial Impression / Assessment and Plan / UC Course  I have reviewed the triage vital signs and the nursing notes.  Pertinent labs & imaging results that were available during my care of the patient were reviewed by me and considered in my medical decision making (see chart for details).     MDM: 1.  Right foot pain-right foot x-ray  revealed above. Advised of x-ray results today with hardcopy provided to patient.  Advised patient to RICE affected area of right foot for 30 minutes 3 times daily for the next 3 days.  Advised if symptoms worsen and/or unresolved please follow-up with [facility] at [facility] [address] [place], [place] [address] [phone].  Discharged home, hemodynamically stable. Final Clinical Impressions(s) / UC Diagnoses   Final diagnoses:  Right foot pain     Discharge Instructions      Advised of x-ray results today with hardcopy provided to patient.  Advised patient to RICE affected area of right foot for 30 minutes 3 times daily for the next 3 days.  Advised if symptoms worsen and/or unresolved please follow-up with [facility] at [facility] [address] [place], [place] [address] [phone].     ED Prescriptions   None    PDMP not reviewed this encounter.   Trevor Iha, FNP 08/06/22 1000

## 2022-08-06 NOTE — Discharge Instructions (Addendum)
Advised of x-ray results today with hardcopy provided to patient.  Advised patient to RICE affected area of right foot for 30 minutes 3 times daily for the next 3 days.  Advised if symptoms worsen and/or unresolved please follow-up with [facility] at [facility] [address] [place], [place] [address] [phone].

## 2022-08-06 NOTE — ED Triage Notes (Signed)
Patient presents to UC for right foot pain x 4 months. States she is taking tylenol for pain.   Denies injury.

## 2022-08-10 ENCOUNTER — Ambulatory Visit (INDEPENDENT_AMBULATORY_CARE_PROVIDER_SITE_OTHER): Payer: BLUE CROSS/BLUE SHIELD | Admitting: Physician Assistant

## 2022-08-10 ENCOUNTER — Encounter: Payer: Self-pay | Admitting: Physician Assistant

## 2022-08-10 DIAGNOSIS — M25571 Pain in right ankle and joints of right foot: Secondary | ICD-10-CM

## 2022-08-10 DIAGNOSIS — M25572 Pain in left ankle and joints of left foot: Secondary | ICD-10-CM

## 2022-08-10 MED ORDER — DICLOFENAC SODIUM 75 MG PO TBEC: DELAYED_RELEASE_TABLET | ORAL | 2 refills | Status: DC

## 2022-08-10 NOTE — Progress Notes (Addendum)
Office Visit Note   Patient: Morgan Nelson           Date of Birth: 1995-05-27           MRN: [id number] Visit Date: 08/10/2022              Requested by: No referring provider defined for this encounter. PCP: Patient, No Pcp Per   Assessment & Plan: Visit Diagnoses:  1. 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  At this point, I believe the patient's symptoms are painful enough to place her in a cam boot weightbearing as tolerated.  I sent in oral anti-inflammatories as well as provided her with a handout for topical Voltaren.  She will follow-up with [place] in 2 weeks for recheck.  Call with concerns or questions in the meantime.  Follow-Up Instructions: Return in about 2 weeks (around 08/24/2022).   Orders:  No orders of the defined types were placed in this encounter.  Meds ordered this encounter  Medications   diclofenac (VOLTAREN) 75 MG EC tablet    Sig: Take one pill bid x 2 weeks and then one pill bid prn pain    Dispense:  60 tablet    Refill:  2      Procedures: No procedures performed   Clinical Data: No additional findings.   Subjective: Chief Complaint  Patient presents with   Right Ankle - Pain    HPI patient is a pleasant 27 year old female who comes in today with pain to her right foot and ankle.  She noticed this several months ago and has recently worsened.  She denies any injury or change in activity.  The pain is to the arch of the foot and into the medial ankle.  Symptoms are constant with associated swelling.  Pain appears to be worse at the end of the day.  She has tried Tylenol without significant relief.  Review of Systems as detailed in HPI.  All others reviewed and are negative.   Objective: Vital Signs: LMP 08/04/2022 (Approximate)   Physical Exam well-developed well-nourished female no acute distress.  Alert and oriented x 3.  Ortho Exam right  foot/ankle exam shows moderate tenderness at the heel just over the plantar fascia insertion.  She also has tenderness along the medial midfoot and into the posterior tibial tendon.  Increased pain with plantarflexion.  She is neurovascularly intact distally.  Specialty Comments:  No specialty comments available.  Imaging: No new imaging   PMFS History: Patient Active Problem List   Diagnosis Date Noted   Migraine with aura 05/04/2021   IUD (intrauterine device) in place 05/04/2021   Personal history of corrected cleft lip and palate    Past Medical History:  Diagnosis Date   Hx of cleft palate with cleft lip     Family History  Problem Relation Age of Onset   Hypertension Mother    Arthritis Neg Hx    Alcohol abuse Neg Hx    Asthma Neg Hx    Birth defects Neg Hx    Cancer Neg Hx    COPD Neg Hx    Depression Neg Hx    Diabetes Neg Hx    Drug abuse Neg Hx    Early death Neg Hx    Hearing loss Neg Hx    Hyperlipidemia Neg Hx    Heart disease Neg Hx    Kidney disease  Neg Hx    Learning disabilities Neg Hx    Mental illness Neg Hx    Mental retardation Neg Hx    Miscarriages / Stillbirths Neg Hx    Stroke Neg Hx    Vision loss Neg Hx    Varicose Veins Neg Hx     Past Surgical History:  Procedure Laterality Date   CLEFT LIP REPAIR     CLEFT LIP REPAIR     Social History   Occupational History   Not on file  Tobacco Use   Smoking status: Never   Smokeless tobacco: Never  Vaping Use   Vaping Use: Never used  Substance and Sexual Activity   Alcohol use: No   Drug use: No   Sexual activity: Not on file

## 2022-08-24 ENCOUNTER — Ambulatory Visit (INDEPENDENT_AMBULATORY_CARE_PROVIDER_SITE_OTHER): Payer: BLUE CROSS/BLUE SHIELD | Admitting: Orthopaedic Surgery

## 2022-08-24 DIAGNOSIS — M25571 Pain in right ankle and joints of right foot: Secondary | ICD-10-CM | POA: Diagnosis not present

## 2022-08-24 MED ORDER — DICLOFENAC SODIUM 75 MG PO TBEC: DELAYED_RELEASE_TABLET | ORAL | 2 refills | Status: AC

## 2022-08-24 NOTE — Progress Notes (Signed)
Office Visit Note   Patient: Morgan Nelson           Date of Birth: 1996-01-16           MRN: [id number] Visit Date: 08/24/2022              Requested by: No referring provider defined for this encounter. PCP: Patient, No Pcp Per   Assessment & Plan: Visit Diagnoses:  1. Pain in right ankle and joints of right foot     Plan: Impression is chronic right foot and ankle pain.  At this point, the patient seems equally symptomatic to the bump on the plantar aspect of the foot in addition to the posterior tibial tendon.  She has been wearing a cam boot and using topical anti-inflammatories.  I would like to go ahead and order MRIs of both the right foot and ankle to assess for structural abnormalities.  She will follow-up with [place] once this is been completed.  I have sent in the oral NSAIDs to a different pharmacy that will hopefully have them in stock.  She will let us know if she is unable to get these.  Follow-Up Instructions: Return for f/u after MRI.   Orders:  Orders Placed This Encounter  Procedures   MR Foot Right w/o contrast   MR Ankle Right w/o contrast   No orders of the defined types were placed in this encounter.     Procedures: No procedures performed   Clinical Data: No additional findings.   Subjective: Chief Complaint  Patient presents with   Right Ankle - Pain    HPI patient is a pleasant 27 year old female who comes in today with chronic right foot and ankle pain.  She was seen by me about 2 weeks ago where I placed her in a cam boot.  She notes that her pain has improved but is still there.  The pain she is continuing to have is to the medial ankle along the posterior tibial tendon as well as into the plantar aspect of the midfoot where she feels a bump.  Both areas seem to be equally as painful.  She has been using topical anti-inflammatories in addition to Tylenol.  She was unable to pick up her prescription anti-inflammatories as the pharmacy was out of  stock.  Review of Systems as detailed in HPI.  All others reviewed and are negative.   Objective: Vital Signs: LMP 08/04/2022 (Approximate)   Physical Exam well-developed well-nourished female no acute distress.  Alert and oriented x 3.  Ortho Exam right foot exam reveals moderate tenderness along the posterior tibial tendon.  I do not appreciate a bump with any sort to the plantar aspect of the foot.  She does have increased plain dorsiflexion and eversion.  She is neurovascular intact distally.  Specialty Comments:  No specialty comments available.  Imaging: No new imaging   PMFS History: Patient Active Problem List   Diagnosis Date Noted   Migraine with aura 05/04/2021   IUD (intrauterine device) in place 05/04/2021   Personal history of corrected cleft lip and palate    Past Medical History:  Diagnosis Date   Hx of cleft palate with cleft lip     Family History  Problem Relation Age of Onset   Hypertension Mother    Arthritis Neg Hx    Alcohol abuse Neg Hx    Asthma Neg Hx    Birth defects Neg Hx    Cancer Neg Hx  COPD Neg Hx    Depression Neg Hx    Diabetes Neg Hx    Drug abuse Neg Hx    Early death Neg Hx    Hearing loss Neg Hx    Hyperlipidemia Neg Hx    Heart disease Neg Hx    Kidney disease Neg Hx    Learning disabilities Neg Hx    Mental illness Neg Hx    Mental retardation Neg Hx    Miscarriages / Stillbirths Neg Hx    Stroke Neg Hx    Vision loss Neg Hx    Varicose Veins Neg Hx     Past Surgical History:  Procedure Laterality Date   CLEFT LIP REPAIR     CLEFT LIP REPAIR     Social History   Occupational History   Not on file  Tobacco Use   Smoking status: Never   Smokeless tobacco: Never  Vaping Use   Vaping Use: Never used  Substance and Sexual Activity   Alcohol use: No   Drug use: No   Sexual activity: Not on file

## 2022-09-22 ENCOUNTER — Encounter: Payer: Self-pay | Admitting: Orthopaedic Surgery

## 2022-09-28 ENCOUNTER — Telehealth: Payer: Self-pay | Admitting: Orthopaedic Surgery

## 2022-09-28 NOTE — Telephone Encounter (Signed)
Pt needs new MRI referral sent over GI is OON please advise Pt stated also that the boot she has on her right ankle is bruising her leg and would like to know what she can do about it

## 2022-09-28 NOTE — Telephone Encounter (Signed)
Called and LMOM for patient.  

## 2022-09-28 NOTE — Telephone Encounter (Signed)
Waiting on PA with [employer] but has been approved with [employer]. Still pending auth with [employer]

## 2022-09-30 ENCOUNTER — Other Ambulatory Visit: Payer: BLUE CROSS/BLUE SHIELD

## 2022-11-02 ENCOUNTER — Telehealth: Payer: Self-pay | Admitting: Orthopaedic Surgery

## 2022-11-02 NOTE — Telephone Encounter (Signed)
Tried to call patient back. No answer. LMOM that she can call us back if she still needs [place]. Message did not state what she needed.

## 2022-11-02 NOTE — Telephone Encounter (Signed)
Patient called wanting to talk about her
